# Patient Record
Sex: Male | Born: 1985 | Race: White | Hispanic: No | Marital: Married | Smoking: Never smoker
Health system: Southern US, Community
[De-identification: ages and names within clinical notes are randomized; demographics above are authoritative.]

---

## 2006-02-23 ENCOUNTER — Encounter: Admission: RE | Admit: 2006-02-23 | Discharge: 2006-02-23 | Payer: Self-pay | Admitting: Occupational Medicine

## 2006-03-10 ENCOUNTER — Emergency Department (HOSPITAL_COMMUNITY): Admission: EM | Admit: 2006-03-10 | Discharge: 2006-03-10 | Payer: Self-pay | Admitting: Emergency Medicine

## 2011-02-26 ENCOUNTER — Ambulatory Visit (HOSPITAL_COMMUNITY)
Admission: RE | Admit: 2011-02-26 | Discharge: 2011-02-26 | Disposition: A | Discharge status: Home / Self Care | Payer: 59 | Source: Ambulatory Visit | Attending: Physician Assistant | Admitting: Physician Assistant

## 2011-02-26 ENCOUNTER — Other Ambulatory Visit (HOSPITAL_COMMUNITY): Payer: Self-pay | Admitting: Physician Assistant

## 2011-02-26 DIAGNOSIS — S62309A Unspecified fracture of unspecified metacarpal bone, initial encounter for closed fracture: Secondary | ICD-10-CM | POA: Insufficient documentation

## 2011-02-26 DIAGNOSIS — M79609 Pain in unspecified limb: Secondary | ICD-10-CM | POA: Insufficient documentation

## 2011-02-26 DIAGNOSIS — M25549 Pain in joints of unspecified hand: Secondary | ICD-10-CM

## 2011-02-26 DIAGNOSIS — X58XXXA Exposure to other specified factors, initial encounter: Secondary | ICD-10-CM | POA: Insufficient documentation

## 2012-09-02 ENCOUNTER — Emergency Department (HOSPITAL_COMMUNITY)
Admission: EM | Admit: 2012-09-02 | Discharge: 2012-09-02 | Disposition: A | Discharge status: Home / Self Care | Payer: 59 | Attending: Emergency Medicine | Admitting: Emergency Medicine

## 2012-09-02 ENCOUNTER — Emergency Department (HOSPITAL_COMMUNITY): Payer: 59

## 2012-09-02 ENCOUNTER — Encounter (HOSPITAL_COMMUNITY): Payer: Self-pay

## 2012-09-02 DIAGNOSIS — Y92410 Unspecified street and highway as the place of occurrence of the external cause: Secondary | ICD-10-CM | POA: Insufficient documentation

## 2012-09-02 DIAGNOSIS — S63509A Unspecified sprain of unspecified wrist, initial encounter: Secondary | ICD-10-CM | POA: Insufficient documentation

## 2012-09-02 DIAGNOSIS — Y9389 Activity, other specified: Secondary | ICD-10-CM | POA: Insufficient documentation
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
## 2012-09-03 NOTE — ED Provider Notes (Signed)
Medical screening examination/treatment/procedure(s) were performed by non-physician practitioner and as supervising physician I was immediately available for consultation/collaboration.  Francesca Strome L Lateesha Bezold, MD 09/03/12 1103 

## 2013-07-09 ENCOUNTER — Emergency Department (HOSPITAL_BASED_OUTPATIENT_CLINIC_OR_DEPARTMENT_OTHER): Payer: 59

## 2013-07-09 ENCOUNTER — Encounter (HOSPITAL_BASED_OUTPATIENT_CLINIC_OR_DEPARTMENT_OTHER): Payer: Self-pay | Admitting: Emergency Medicine

## 2013-07-09 ENCOUNTER — Emergency Department (HOSPITAL_BASED_OUTPATIENT_CLINIC_OR_DEPARTMENT_OTHER)
Admission: EM | Admit: 2013-07-09 | Discharge: 2013-07-09 | Disposition: A | Discharge status: Home / Self Care | Payer: 59 | Attending: Emergency Medicine | Admitting: Emergency Medicine

## 2013-07-09 DIAGNOSIS — N451 Epididymitis: Secondary | ICD-10-CM

## 2013-07-09 DIAGNOSIS — N453 Epididymo-orchitis: Secondary | ICD-10-CM | POA: Insufficient documentation

## 2013-07-09 LAB — URINALYSIS, ROUTINE W REFLEX MICROSCOPIC
Bilirubin Urine: NEGATIVE
Glucose, UA: NEGATIVE mg/dL
Hgb urine dipstick: NEGATIVE
KETONES UR: NEGATIVE mg/dL
LEUKOCYTES UA: NEGATIVE
NITRITE: NEGATIVE
PH: 6 (ref 5.0–8.0)
PROTEIN: NEGATIVE mg/dL
Specific Gravity, Urine: 1.008 (ref 1.005–1.030)
UROBILINOGEN UA: 0.2 mg/dL (ref 0.0–1.0)

## 2013-07-09 MED ORDER — CEFTRIAXONE SODIUM 250 MG IJ SOLR: 250.0000 mg | Freq: Once | INTRAMUSCULAR | Status: DC

## 2013-07-09 MED ORDER — DOXYCYCLINE HYCLATE 100 MG PO TABS: 100.0000 mg | ORAL_TABLET | Freq: Once | ORAL | Status: DC

## 2013-07-09 NOTE — ED Notes (Signed)
Pt returned from Ultrasound.

## 2013-07-09 NOTE — Discharge Instructions (Signed)
Use rest, Ibuprofen, ice and elevation of your scrotum when you go home. If your pain worsens or you notice swelling, redness or other concerning symptoms then return to the ER for evaluation  Epididymitis Epididymitis is a swelling (inflammation) of the epididymis. The epididymis is a cord-like structure along the back part of the testicle. Epididymitis is usually, but not always, caused by infection. This is usually a sudden problem beginning with chills, fever and pain behind the scrotum and in the testicle. There may be swelling and redness of the testicle. DIAGNOSIS  Physical examination will reveal a tender, swollen epididymis. Sometimes, cultures are obtained from the urine or from prostate secretions to help find out if there is an infection or if the cause is a different problem. Sometimes, blood work is performed to see if your white blood cell count is elevated and if a germ (bacterial) or viral infection is present. Using this knowledge, an appropriate medicine which kills germs (antibiotic) can be chosen by your caregiver. A viral infection causing epididymitis will most often go away (resolve) without treatment. HOME CARE INSTRUCTIONS   Hot sitz baths for 20 minutes, 4 times per day, may help relieve pain.  Only take over-the-counter or prescription medicines for pain, discomfort or fever as directed by your caregiver.  Take all medicines, including antibiotics, as directed. Take the antibiotics for the full prescribed length of time even if you are feeling better.  It is very important to keep all follow-up appointments. SEEK IMMEDIATE MEDICAL CARE IF:   You have a fever.  You have pain not relieved with medicines.  You have any worsening of your problems.  Your pain seems to come and go.  You develop pain, redness, and swelling in the scrotum and surrounding areas. MAKE SURE YOU:   Understand these instructions.  Will watch your condition.  Will get help right away if  you are not doing well or get worse. Document Released: 04/16/2000 Document Revised: 07/12/2011 Document Reviewed: 03/06/2009 [facility] Patient Information 2014 [employer], [place].

## 2013-07-09 NOTE — ED Notes (Signed)
Right testicle pain onset last night pain worse sitting down but never goes away denies injury or difficulty urinating or any other pain

## 2013-07-09 NOTE — ED Provider Notes (Signed)
CSN: [id number]     Arrival date & time 07/09/13  0908 History   First MD Initiated Contact with Patient 07/09/13 [phone]     Chief Complaint  Patient presents with  . Testicle Pain     (Consider location/radiation/quality/duration/timing/severity/associated sxs/prior Treatment) HPI Comments: 28 year old male presents with right testicle pain since yesterday. He states he noticed it yesterday afternoon/evening. He felt like he had accidentally hit it but does not report actual injury. He feels like a dull pain. She is an 8/10. Is not particularly gotten worse but as this continues got concerned about her. Does not notice any bulges or hernias. He states he's not noticed any swelling. No dysuria or discharge. He is married and currently is no concern for an STD. He has never had this before. Patient states a couple days ago he was running on elliptical for the first time in a while and is concerned this may have caused the symptoms.   History reviewed. No pertinent past medical history. History reviewed. No pertinent past surgical history. History reviewed. No pertinent family history. History  Substance Use Topics  . Smoking status: Never Smoker   . Smokeless tobacco: Not on file  . Alcohol Use: No    Review of Systems  Constitutional: Negative for fever.  Gastrointestinal: Negative for vomiting and abdominal pain.  Genitourinary: Positive for testicular pain. Negative for dysuria, discharge, penile swelling, scrotal swelling and penile pain.  All other systems reviewed and are negative.      Allergies  Review of patient's allergies indicates no known allergies.  Home Medications  No current outpatient prescriptions on file. BP 152/92  Pulse 70  Temp(Src) 97.8 F (36.6 C) (Oral)  Ht 6' (1.829 m)  Wt 200 lb (90.719 kg)  BMI 27.12 kg/m2  SpO2 100% Physical Exam  Nursing note and vitals reviewed. Constitutional: He is oriented to person, place, and time. He appears  well-developed and well-nourished.  HENT:  Head: Normocephalic and atraumatic.  Right Ear: External ear normal.  Left Ear: External ear normal.  Nose: Nose normal.  Eyes: Right eye exhibits no discharge. Left eye exhibits no discharge.  Neck: Neck supple.  Cardiovascular: Normal rate.   Pulmonary/Chest: Effort normal.  Abdominal: Soft. He exhibits no distension. There is no tenderness. Hernia confirmed negative in the right inguinal area and confirmed negative in the left inguinal area.  Genitourinary: Penis normal. Right testis shows tenderness (mild, nonspecific tenderness). Right testis shows no swelling. Right testis is descended. Left testis shows no swelling and no tenderness. Left testis is descended.  Musculoskeletal: He exhibits no edema.  Neurological: He is alert and oriented to person, place, and time.  Skin: Skin is warm and dry.    ED Course  Procedures (including critical care time) Labs Review Labs Reviewed  URINALYSIS, ROUTINE W REFLEX MICROSCOPIC   Imaging Review US Scrotum  07/09/2013   CLINICAL DATA:  Right scrotal pain  EXAM: SCROTAL ULTRASOUND  DOPPLER ULTRASOUND OF THE TESTICLES  TECHNIQUE: Complete ultrasound examination of the testicles, epididymis, and other scrotal structures was performed. Color and spectral Doppler ultrasound were also utilized to evaluate blood flow to the testicles.  COMPARISON:  None.  FINDINGS: Right testicle  Measurements: Normal in size.  No mass or microlithiasis visualized.  Left testicle  Measurements: Normal in size. No mass or microlithiasis visualized.  Right epididymis: Hypervascular. 3 mm benign-appearing epididymal cyst.  Left epididymis: Normal vascularity. Several small benign-appearing epididymal cysts. The largest is 4 mm containing a septation.  Hydrocele:  Small right hydrocele.  Varicocele:  None visualized.  Pulsed Doppler interrogation of both testes demonstrates low resistance arterial and venous waveforms bilaterally.   IMPRESSION: No evidence of testicular torsion.  The right epididymal head is hypervascular and there is a small right hydrocele. Findings suggest an inflammatory process such as epididymitis.   Electronically Signed   By: Maryclare BeanArt  Hoss M.D.   On: 07/09/2013 10:33   [place] Art/ven Flow Abd Pelv Doppler  07/09/2013   CLINICAL DATA:  Right scrotal pain  EXAM: SCROTAL ULTRASOUND  DOPPLER ULTRASOUND OF THE TESTICLES  TECHNIQUE: Complete ultrasound examination of the testicles, epididymis, and other scrotal structures was performed. Color and spectral Doppler ultrasound were also utilized to evaluate blood flow to the testicles.  COMPARISON:  None.  FINDINGS: Right testicle  Measurements: Normal in size.  No mass or microlithiasis visualized.  Left testicle  Measurements: Normal in size. No mass or microlithiasis visualized.  Right epididymis: Hypervascular. 3 mm benign-appearing epididymal cyst.  Left epididymis: Normal vascularity. Several small benign-appearing epididymal cysts. The largest is 4 mm containing a septation.  Hydrocele:  Small right hydrocele.  Varicocele:  None visualized.  Pulsed Doppler interrogation of both testes demonstrates low resistance arterial and venous waveforms bilaterally.  IMPRESSION: No evidence of testicular torsion.  The right epididymal head is hypervascular and there is a small right hydrocele. Findings suggest an inflammatory process such as epididymitis.   Electronically Signed   By: Maryclare BeanArt  Hoss M.D.   On: 07/09/2013 10:33     EKG Interpretation None      MDM   Final diagnoses:  Epididymitis, right    Patient's ultrasound shows likely epididymitis. Patient has no external signs such as erythema or swelling. Patient states he is "100% sure" that he is negative when questioned about possible STDs. He does not want to be tested or have antibiotics. At this is questionable epididymitis I feel is reasonable to treat with NSAIDs, ice, elevation, and PCP followup if symptoms worsen.  No signs of torsion.    Audree CamelScott T Rohnan Bartleson, MD 07/09/13 [phone]

## 2014-08-31 IMAGING — US US SCROTUM
1 series · 14 of 25 positions shown · non-contrast
Comparison: None.

CLINICAL DATA: Right scrotal pain

EXAM:
SCROTAL ULTRASOUND
DOPPLER ULTRASOUND OF THE TESTICLES
TECHNIQUE: Complete ultrasound examination of the testicles, epididymis, and
other scrotal structures was performed. Color and spectral Doppler
ultrasound were also utilized to evaluate blood flow to the
testicles.

[Series 1: us scrotum · 0.08mm/px · 14 of 35 slices shown]
[im 1/35]
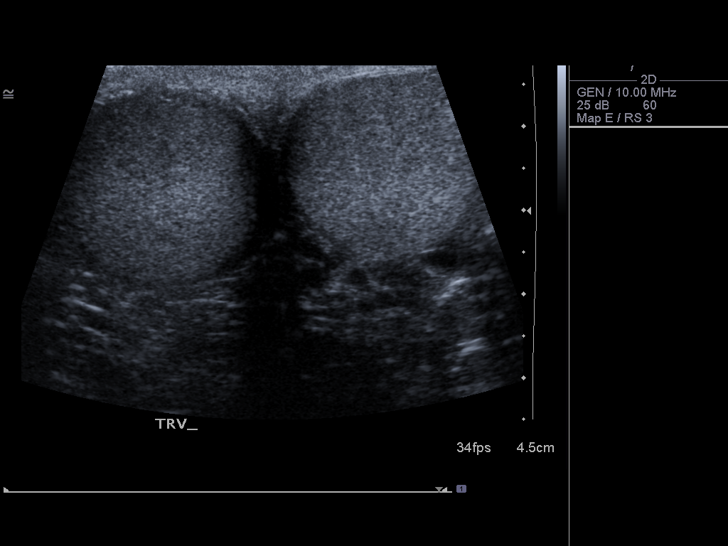
[im 3/35]
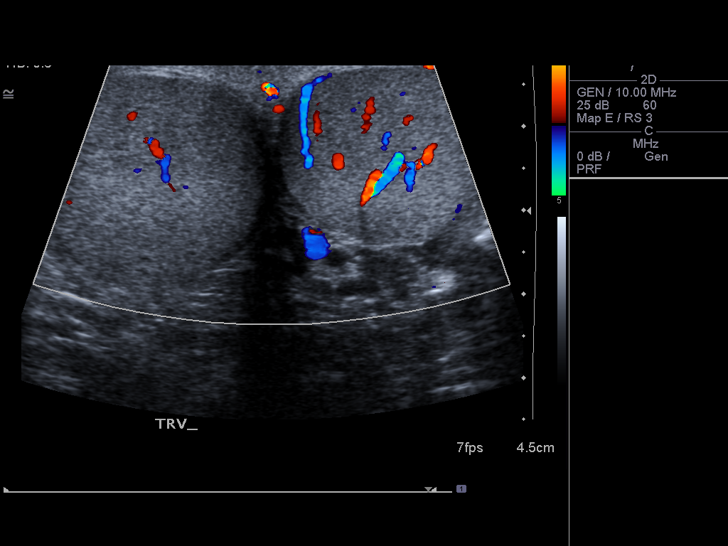
[im 6/35]
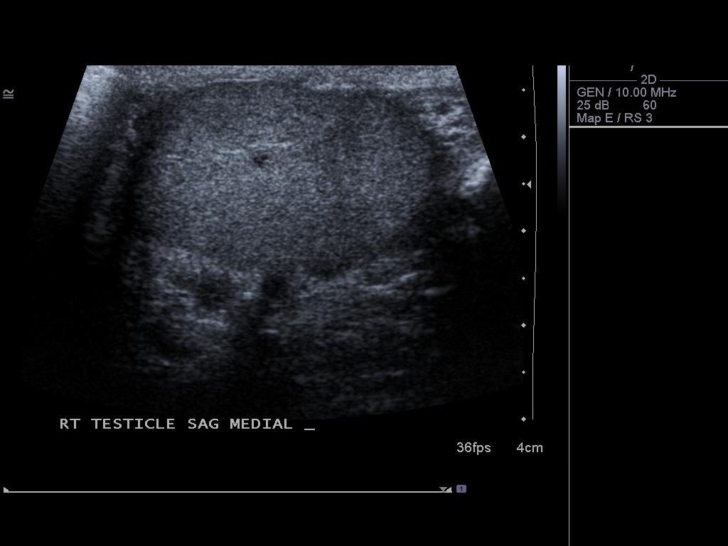
[im 9/35]
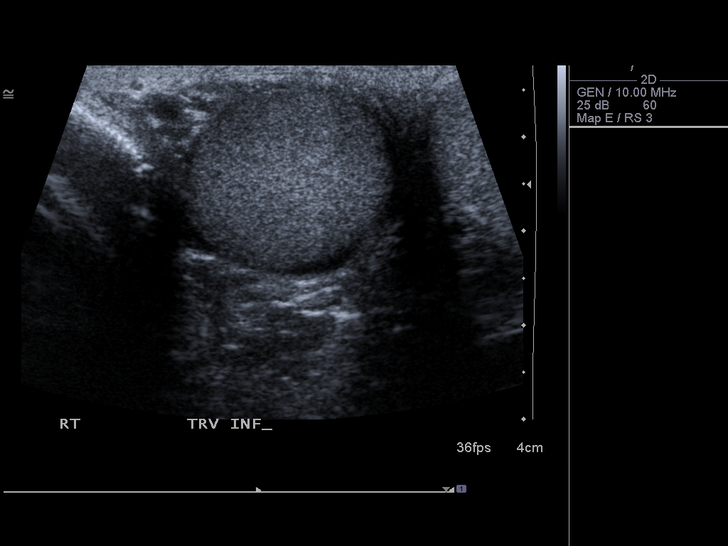
[im 12/35]
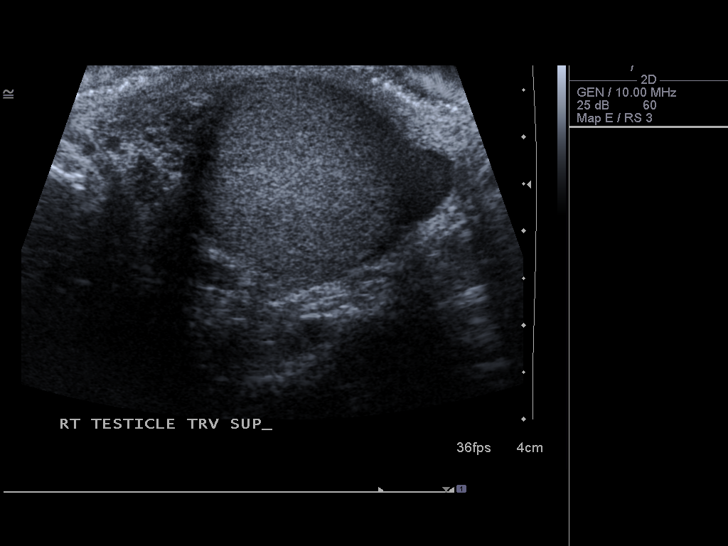
[im 13/35]
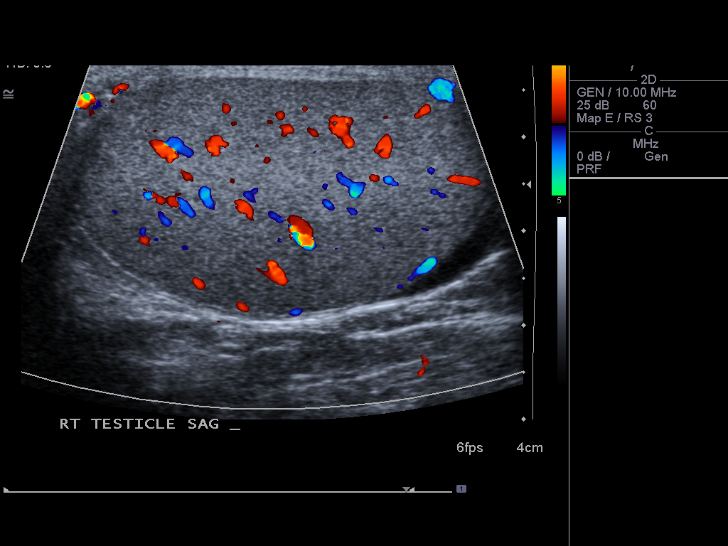
[im 16/35]
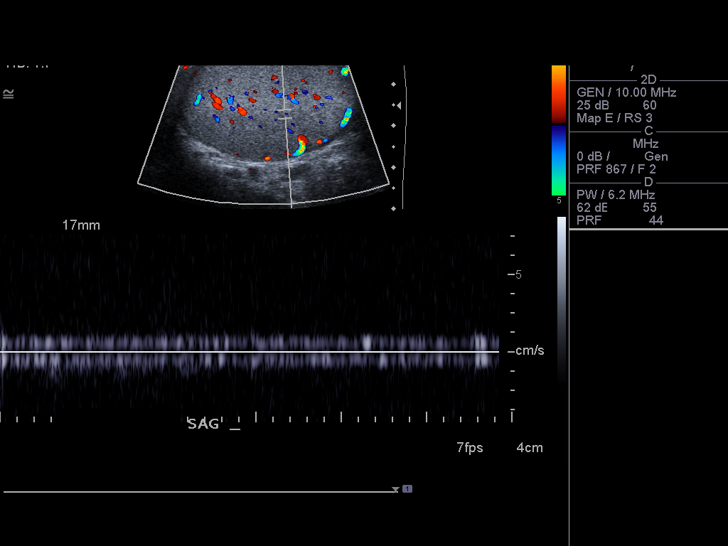
[im 19/35]
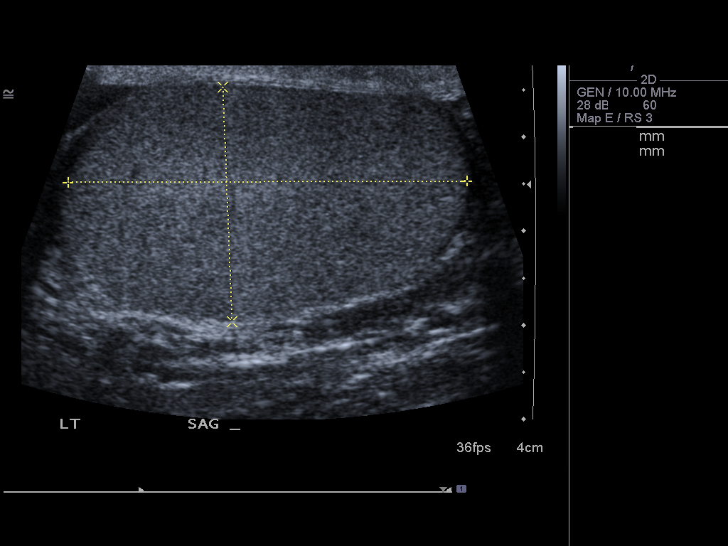
[im 22/35]
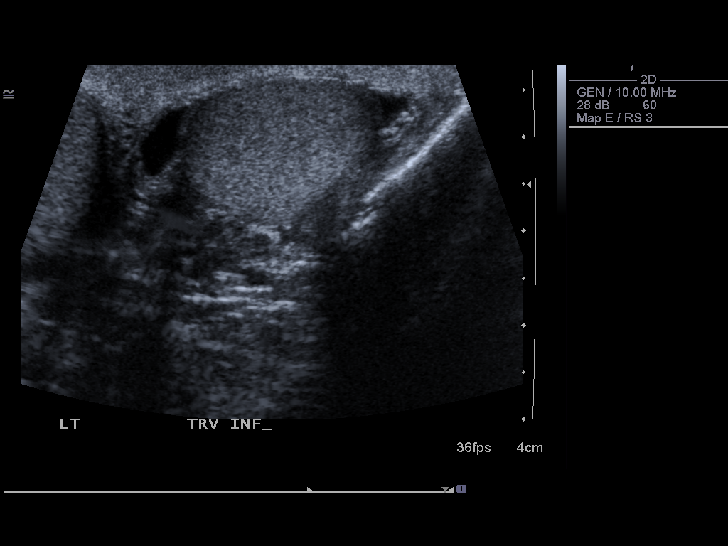
[im 23/35]
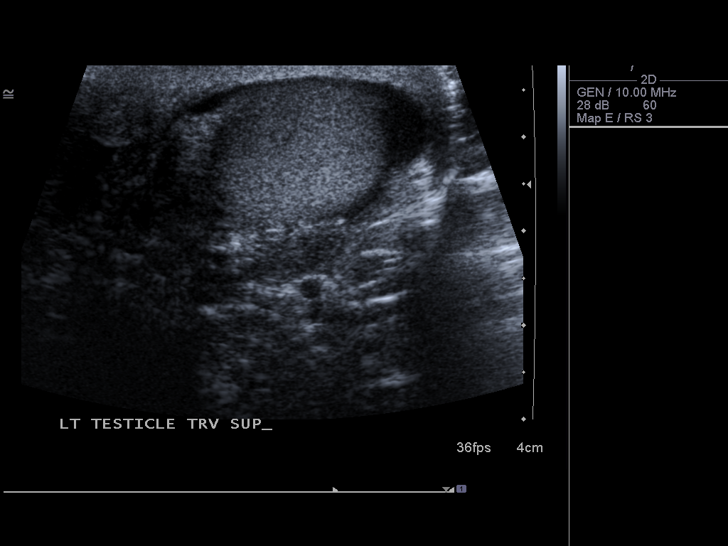
[im 26/35]
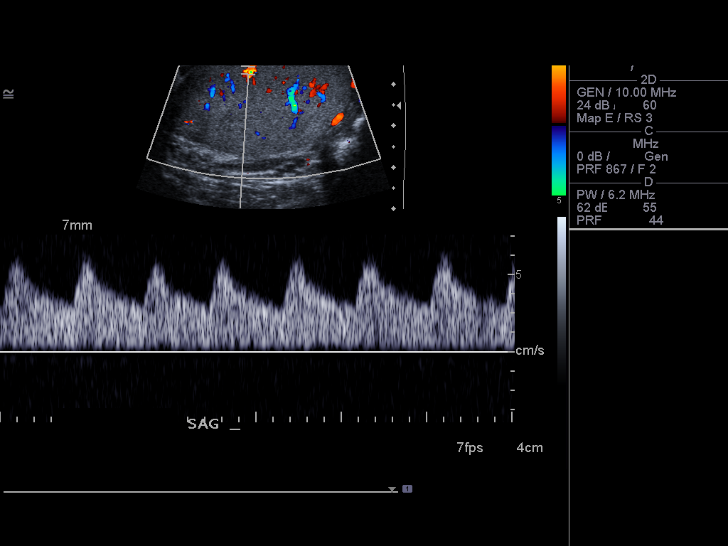
[im 29/35]
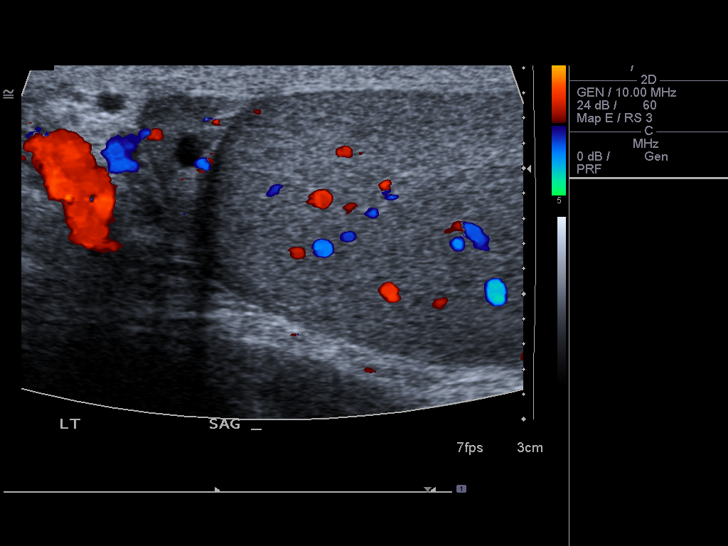
[im 32/35]
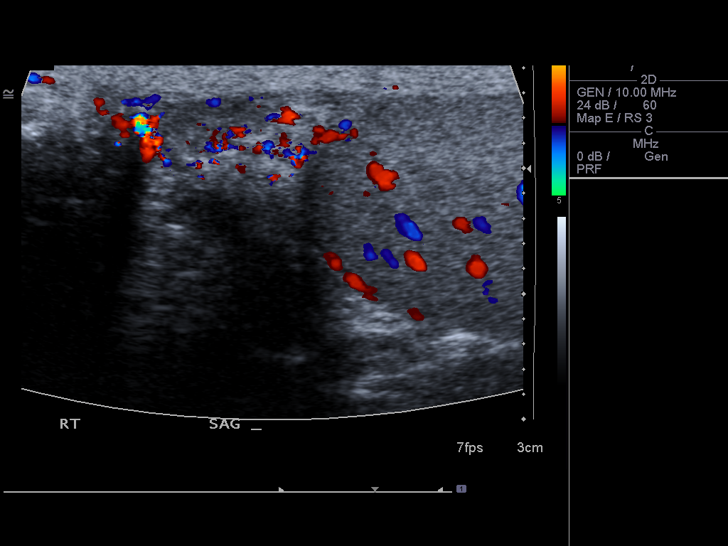
[im 35/35]
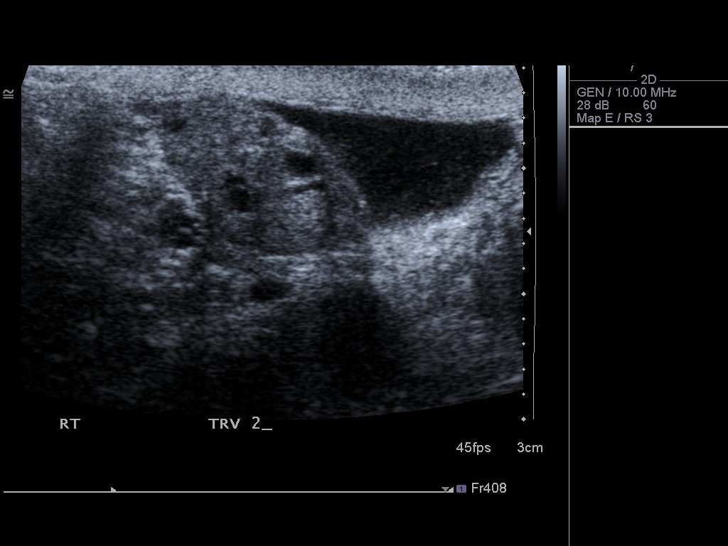

[14 of 25 positions shown; findings below may reference images not displayed]

FINDINGS: Right testicle

Measurements: Normal in size.  No mass or microlithiasis visualized.

Left testicle

Measurements: Normal in size. No mass or microlithiasis visualized.

Right epididymis: Hypervascular. 3 mm benign-appearing epididymal
cyst.

Left epididymis: Normal vascularity. Several small benign-appearing
epididymal cysts. The largest is 4 mm containing a septation.

Hydrocele:  Small right hydrocele.

Varicocele:  None visualized.

Pulsed Doppler interrogation of both testes demonstrates low
resistance arterial and venous waveforms bilaterally.
IMPRESSION: No evidence of testicular torsion.

The right epididymal head is hypervascular and there is a small
right hydrocele. Findings suggest an inflammatory process such as
epididymitis.

## 2018-05-16 DIAGNOSIS — R05 Cough: Secondary | ICD-10-CM | POA: Diagnosis not present

## 2018-05-16 DIAGNOSIS — J069 Acute upper respiratory infection, unspecified: Secondary | ICD-10-CM | POA: Diagnosis not present

## 2018-05-18 DIAGNOSIS — Z6829 Body mass index (BMI) 29.0-29.9, adult: Secondary | ICD-10-CM | POA: Diagnosis not present

## 2018-05-18 DIAGNOSIS — R52 Pain, unspecified: Secondary | ICD-10-CM | POA: Diagnosis not present

## 2018-05-18 DIAGNOSIS — E663 Overweight: Secondary | ICD-10-CM | POA: Diagnosis not present

## 2018-05-18 DIAGNOSIS — Z1389 Encounter for screening for other disorder: Secondary | ICD-10-CM | POA: Diagnosis not present

## 2018-05-18 DIAGNOSIS — J069 Acute upper respiratory infection, unspecified: Secondary | ICD-10-CM | POA: Diagnosis not present

## 2019-07-23 DIAGNOSIS — R002 Palpitations: Secondary | ICD-10-CM | POA: Diagnosis not present

## 2019-07-23 DIAGNOSIS — R748 Abnormal levels of other serum enzymes: Secondary | ICD-10-CM | POA: Diagnosis not present

## 2019-07-23 DIAGNOSIS — R718 Other abnormality of red blood cells: Secondary | ICD-10-CM | POA: Diagnosis not present

## 2019-07-23 DIAGNOSIS — I498 Other specified cardiac arrhythmias: Secondary | ICD-10-CM | POA: Diagnosis not present

## 2019-07-23 DIAGNOSIS — R079 Chest pain, unspecified: Secondary | ICD-10-CM | POA: Diagnosis not present

## 2019-09-03 ENCOUNTER — Encounter: Payer: Self-pay | Admitting: Emergency Medicine

## 2019-09-03 ENCOUNTER — Other Ambulatory Visit: Payer: Self-pay

## 2019-09-03 ENCOUNTER — Ambulatory Visit
Admission: EM | Admit: 2019-09-03 | Discharge: 2019-09-03 | Disposition: A | Discharge status: Home / Self Care | Payer: BC Managed Care – PPO | Attending: Emergency Medicine | Admitting: Emergency Medicine

## 2019-09-03 DIAGNOSIS — Z1152 Encounter for screening for COVID-19: Secondary | ICD-10-CM | POA: Diagnosis not present

## 2019-09-03 DIAGNOSIS — J029 Acute pharyngitis, unspecified: Secondary | ICD-10-CM | POA: Insufficient documentation

## 2019-09-03 MED ORDER — FLUTICASONE PROPIONATE 50 MCG/ACT NA SUSP: 1.0000 | Freq: Every day | NASAL | 0 refills | Status: AC

## 2019-09-03 MED ORDER — BENZONATATE 100 MG PO CAPS: 100.0000 mg | ORAL_CAPSULE | Freq: Three times a day (TID) | ORAL | 0 refills | Status: AC

## 2019-09-03 MED ORDER — MENTHOL 3 MG MT LOZG: 1.0000 | LOZENGE | OROMUCOSAL | 12 refills | Status: AC | PRN

## 2019-09-03 MED ORDER — CETIRIZINE HCL 10 MG PO TABS: 10.0000 mg | ORAL_TABLET | Freq: Every day | ORAL | 0 refills | Status: AC

## 2019-09-03 NOTE — Discharge Instructions (Addendum)
POCT strep test was negative.  Sample will be sent for culture.  Someone will call if result is abnormal  COVID testing ordered.  It will take between 2-7 days for test results.  Someone will contact you regarding abnormal results.    In the meantime: You should remain isolated in your home for 10 days from symptom onset AND greater than 24 hours after symptoms resolution (absence of fever without the use of fever-reducing medication and improvement in respiratory symptoms), whichever is longer Get plenty of rest and push fluids Tessalon Perles prescribed for cough Zyrtec-D prescribed for nasal congestion, runny nose, and/or sore throat Cepacol lozenges for sore throat Flonase prescribed for nasal congestion and runny nose Use medications daily for symptom relief Use OTC medications like ibuprofen or tylenol as needed fever or pain Call or go to the ED if you have any new or worsening symptoms such as fever, worsening cough, shortness of breath, chest tightness, chest pain, turning blue, changes in mental status, etc..Marland Kitchen

## 2019-09-03 NOTE — ED Triage Notes (Signed)
Ore throat for a month.  Cough started 2 days ago.  Patient thought throat related to allergies or recent episodes of reflux.  Slight runny nose today.  Complains of tender lymph nodes  Denies fever.

## 2019-09-03 NOTE — ED Provider Notes (Signed)
[facility]    CSN: [id number] Arrival date & time: 09/03/19  1043      History   Chief Complaint Chief Complaint  Patient presents with  . Sore Throat    HPI Nicholas Leach is a 34 y.o. male.   With history of acid reflux presented to the urgent care with a complaint of cough and sore throat for the past few days.  Denies sick exposure to COVID, flu or strep.  Denies recent travel.  Denies aggravating or alleviating symptoms.  Denies previous COVID infection.   Denies fever, chills, fatigue, nasal congestion, rhinorrhea, SOB, wheezing, chest pain, nausea, vomiting, changes in bowel or bladder habits.    The history is provided by the patient. No language interpreter was used.  Sore Throat    History reviewed. No pertinent past medical history.  There are no problems to display for this patient.   History reviewed. No pertinent surgical history.     Home Medications    Prior to Admission medications   Medication Sig Start Date End Date Taking? Authorizing Provider  OMEPRAZOLE PO Take by mouth.   Yes [provider]  benzonatate (TESSALON) 100 MG capsule Take 1 capsule (100 mg total) by mouth every 8 (eight) hours. 09/03/19   Keyaan Lederman, Zachery Dakins, FNP  cetirizine (ZYRTEC ALLERGY) 10 MG tablet Take 1 tablet (10 mg total) by mouth daily. 09/03/19   Rande Roylance, Zachery Dakins, FNP  fluticasone (FLONASE) 50 MCG/ACT nasal spray Place 1 spray into both nostrils daily for 14 days. 09/03/19 09/17/19  Keevan Wolz, Zachery Dakins, FNP  menthol-cetylpyridinium (CEPACOL) 3 MG lozenge Take 1 lozenge (3 mg total) by mouth as needed for sore throat. 09/03/19   Valerie Fredin, Zachery Dakins, FNP    Family History No family history on file.  Social History Social History   Tobacco Use  . Smoking status: Never Smoker  Substance Use Topics  . Alcohol use: No  . Drug use: No     Allergies   Patient has no known allergies.   Review of Systems Review of Systems  Constitutional: Negative.    HENT: Positive for sore throat.   Respiratory: Positive for cough.   Cardiovascular: Negative.   Gastrointestinal: Negative.   Genitourinary: Negative.   Musculoskeletal: Negative.   Skin: Negative.   Neurological: Negative.   All other systems reviewed and are negative.    Physical Exam Triage Vital Signs ED Triage Vitals  Enc Vitals Group     BP 09/03/19 1119 130/81     Pulse Rate 09/03/19 1119 84     Resp 09/03/19 1119 16     Temp 09/03/19 1119 98.1 F (36.7 C)     Temp Source 09/03/19 1119 Oral     SpO2 09/03/19 1119 97 %     Weight --      Height --      Head Circumference --      Peak Flow --      Pain Score 09/03/19 1133 7     Pain Loc --      Pain Edu? --      Excl. in GC? --    No data found.  Updated Vital Signs BP 130/81 (BP Location: Right Arm)   Pulse 84   Temp 98.1 F (36.7 C) (Oral)   Resp 16   SpO2 97%   Visual Acuity Right Eye Distance:   Left Eye Distance:   Bilateral Distance:    Right Eye Near:   Left Eye  Near:    Bilateral Near:     Physical Exam Vitals and nursing note reviewed.  Constitutional:      General: He is not in acute distress.    Appearance: Normal appearance. He is normal weight. He is not ill-appearing or toxic-appearing.  HENT:     Head: Normocephalic.     Right Ear: Tympanic membrane, ear canal and external ear normal. There is no impacted cerumen.     Left Ear: Tympanic membrane, ear canal and external ear normal. There is no impacted cerumen.     Nose: Nose normal. No congestion.     Mouth/Throat:     Mouth: Mucous membranes are moist.     Pharynx: Oropharynx is clear. No oropharyngeal exudate or posterior oropharyngeal erythema.     Tonsils: 1+ on the right. 1+ on the left.  Cardiovascular:     Rate and Rhythm: Normal rate and regular rhythm.     Pulses: Normal pulses.     Heart sounds: Normal heart sounds. No murmur.  Pulmonary:     Effort: Pulmonary effort is normal. No respiratory distress.     Breath  sounds: Normal breath sounds. No wheezing or rhonchi.  Chest:     Chest wall: No tenderness.  Skin:    Capillary Refill: Capillary refill takes less than 2 seconds.  Neurological:     General: No focal deficit present.     Mental Status: He is alert and oriented to person, place, and time.      UC Treatments / Results  Labs (all labs ordered are listed, but only abnormal results are displayed) Labs Reviewed  CULTURE, GROUP A STREP ([facility])  NOVEL CORONAVIRUS, NAA  POCT RAPID STREP A (OFFICE)    EKG   Radiology No results found.  Procedures Procedures (including critical care time)  Medications Ordered in UC Medications - No data to display  Initial Impression / Assessment and Plan / UC Course  I have reviewed the triage vital signs and the nursing notes.  Pertinent labs & imaging results that were available during my care of the patient were reviewed by me and considered in my medical decision making (see chart for details).     Patient stable at discharge.  POCT strep test was negative.  Culture will be sent.  COVID-19 test was completed for rule out.  Symptom relief medication were prescribed.    Final Clinical Impressions(s) / UC Diagnoses   Final diagnoses:  Sore throat  Encounter for screening for COVID-19     Discharge Instructions     COVID testing ordered.  It will take between 2-7 days for test results.  Someone will contact you regarding abnormal results.    In the meantime: You should remain isolated in your home for 10 days from symptom onset AND greater than 24 hours after symptoms resolution (absence of fever without the use of fever-reducing medication and improvement in respiratory symptoms), whichever is longer Get plenty of rest and push fluids Tessalon Perles prescribed for cough Zyrtec-D prescribed for nasal congestion, runny nose, and/or sore throat Cepacol lozenges for sore throat Flonase prescribed for nasal congestion and runny nose  Use medications daily for symptom relief Use OTC medications like ibuprofen or tylenol as needed fever or pain Call or go to the ED if you have any new or worsening symptoms such as fever, worsening cough, shortness of breath, chest tightness, chest pain, turning blue, changes in mental status, etc...     ED Prescriptions  Medication Sig Dispense Auth. Provider   cetirizine (ZYRTEC ALLERGY) 10 MG tablet Take 1 tablet (10 mg total) by mouth daily. 30 tablet Bryton Romagnoli S, FNP   fluticasone (FLONASE) 50 MCG/ACT nasal spray Place 1 spray into both nostrils daily for 14 days. 16 g Shuronda Santino S, FNP   benzonatate (TESSALON) 100 MG capsule Take 1 capsule (100 mg total) by mouth every 8 (eight) hours. 21 capsule Gwenneth Whiteman, Darrelyn Hillock, FNP   menthol-cetylpyridinium (CEPACOL) 3 MG lozenge Take 1 lozenge (3 mg total) by mouth as needed for sore throat. 100 tablet Tacoya Altizer, Darrelyn Hillock, FNP     PDMP not reviewed this encounter.   Emerson Monte, FNP 09/03/19 1312

## 2019-09-04 LAB — NOVEL CORONAVIRUS, NAA: SARS-CoV-2, NAA: NOT DETECTED

## 2019-09-04 LAB — SARS-COV-2, NAA 2 DAY TAT

## 2019-09-06 LAB — CULTURE, GROUP A STREP (THRC)

## 2020-05-26 ENCOUNTER — Other Ambulatory Visit (HOSPITAL_COMMUNITY): Payer: Self-pay | Admitting: Physician Assistant

## 2020-05-26 DIAGNOSIS — R109 Unspecified abdominal pain: Secondary | ICD-10-CM | POA: Diagnosis not present

## 2020-05-26 DIAGNOSIS — M542 Cervicalgia: Secondary | ICD-10-CM | POA: Diagnosis not present

## 2020-05-26 DIAGNOSIS — Z1331 Encounter for screening for depression: Secondary | ICD-10-CM | POA: Diagnosis not present

## 2020-05-26 DIAGNOSIS — R591 Generalized enlarged lymph nodes: Secondary | ICD-10-CM | POA: Diagnosis not present

## 2020-05-26 DIAGNOSIS — R599 Enlarged lymph nodes, unspecified: Secondary | ICD-10-CM

## 2020-05-26 DIAGNOSIS — K219 Gastro-esophageal reflux disease without esophagitis: Secondary | ICD-10-CM | POA: Diagnosis not present

## 2020-05-26 DIAGNOSIS — Z1389 Encounter for screening for other disorder: Secondary | ICD-10-CM | POA: Diagnosis not present

## 2020-05-26 DIAGNOSIS — Z6829 Body mass index (BMI) 29.0-29.9, adult: Secondary | ICD-10-CM | POA: Diagnosis not present

## 2020-05-29 ENCOUNTER — Ambulatory Visit (HOSPITAL_COMMUNITY): Payer: BC Managed Care – PPO

## 2020-05-30 ENCOUNTER — Ambulatory Visit (HOSPITAL_COMMUNITY)
Admission: RE | Admit: 2020-05-30 | Discharge: 2020-05-30 | Disposition: A | Discharge status: Home / Self Care | Payer: BC Managed Care – PPO | Source: Ambulatory Visit | Attending: Physician Assistant | Admitting: Physician Assistant

## 2020-05-30 ENCOUNTER — Other Ambulatory Visit: Payer: Self-pay

## 2020-05-30 DIAGNOSIS — R070 Pain in throat: Secondary | ICD-10-CM | POA: Diagnosis not present

## 2020-05-30 DIAGNOSIS — R590 Localized enlarged lymph nodes: Secondary | ICD-10-CM | POA: Diagnosis not present

## 2020-05-30 DIAGNOSIS — R599 Enlarged lymph nodes, unspecified: Secondary | ICD-10-CM | POA: Diagnosis not present

## 2020-07-07 DIAGNOSIS — R590 Localized enlarged lymph nodes: Secondary | ICD-10-CM | POA: Diagnosis not present

## 2020-07-07 DIAGNOSIS — K219 Gastro-esophageal reflux disease without esophagitis: Secondary | ICD-10-CM | POA: Diagnosis not present

## 2020-07-07 DIAGNOSIS — R070 Pain in throat: Secondary | ICD-10-CM | POA: Diagnosis not present

## 2021-01-08 DIAGNOSIS — R590 Localized enlarged lymph nodes: Secondary | ICD-10-CM | POA: Diagnosis not present

## 2021-01-08 DIAGNOSIS — K219 Gastro-esophageal reflux disease without esophagitis: Secondary | ICD-10-CM | POA: Diagnosis not present

## 2021-07-22 IMAGING — US US SOFT TISSUE HEAD/NECK
1 series · 13 of 17 positions shown · non-contrast
Comparison: No pertinent prior exams available for comparison.

CLINICAL DATA: Enlarged lymph node. Recurrent lymph nodes in throat
pain. Additional history provided by scanning technologist: Patient
reports palpable lump right side of neck for 1 year. Patient reports
family history of cancer.

EXAM:
ULTRASOUND OF HEAD/NECK SOFT TISSUES
TECHNIQUE: Ultrasound examination of the head and neck soft tissues was
performed in the area of clinical concern.

[Series 1: us soft tissue head/neck · 0.05mm/px · 13 of 17 slices shown]
[im 1/17]
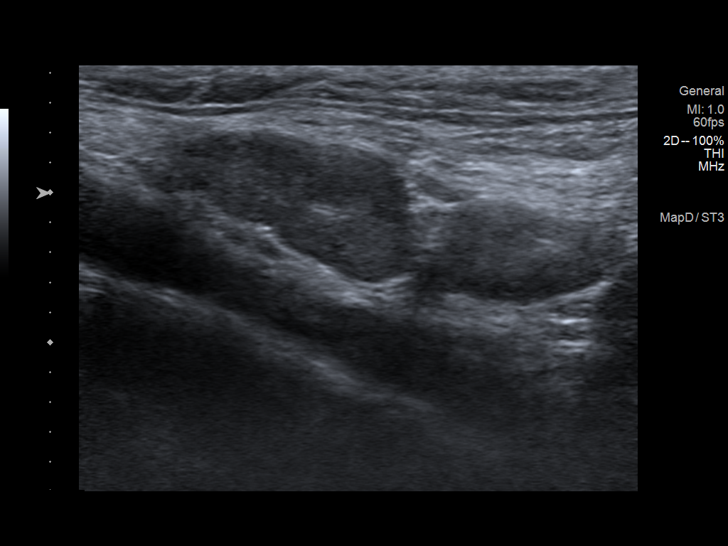
[im 2/17]
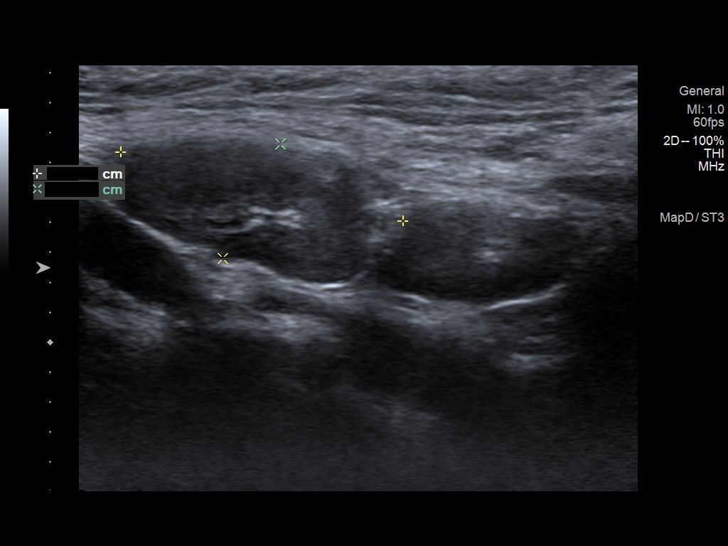
[im 4/17]
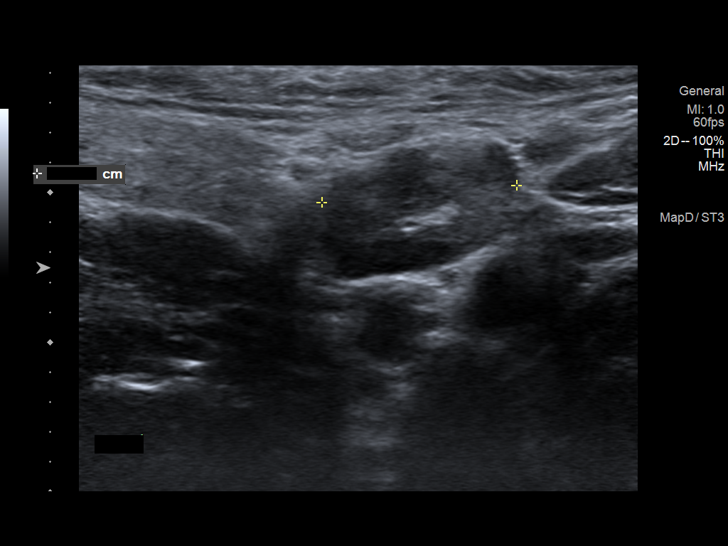
[im 5/17]
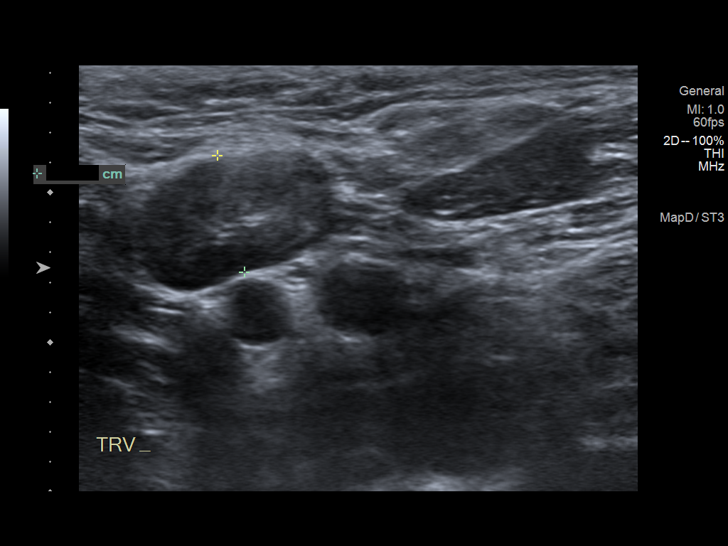
[im 6/17]
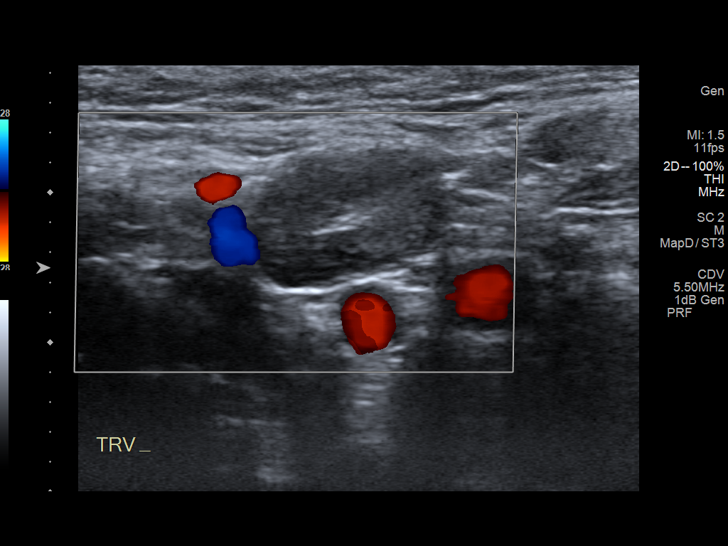
[im 8/17]
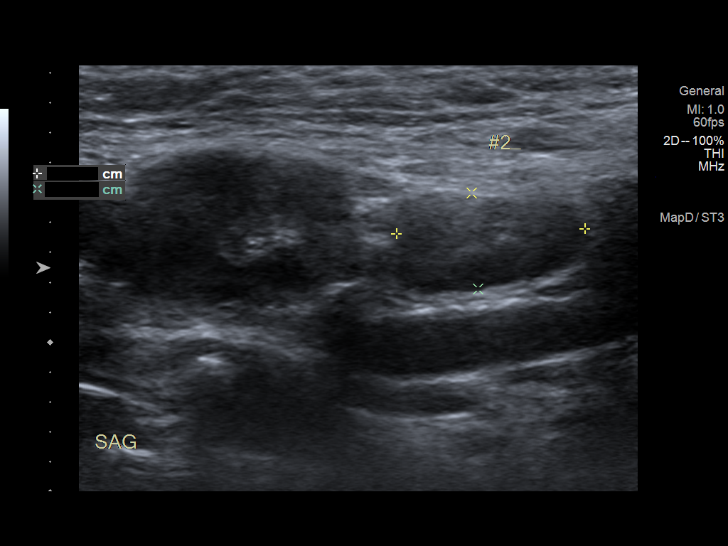
[im 9/17]
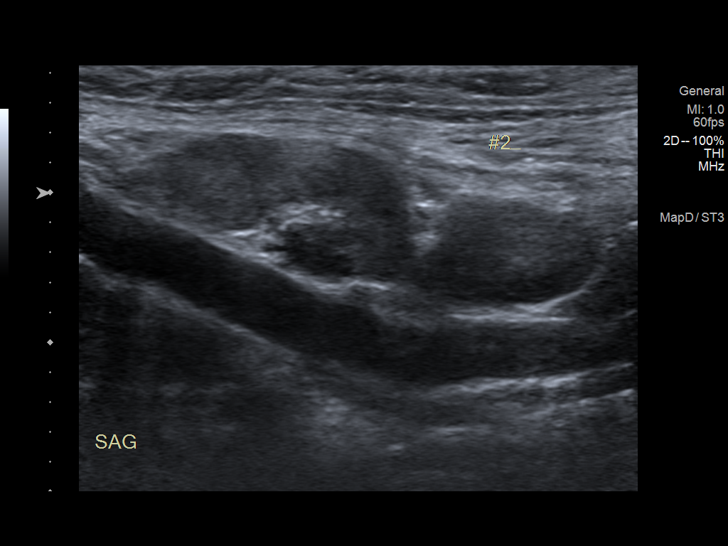
[im 10/17]
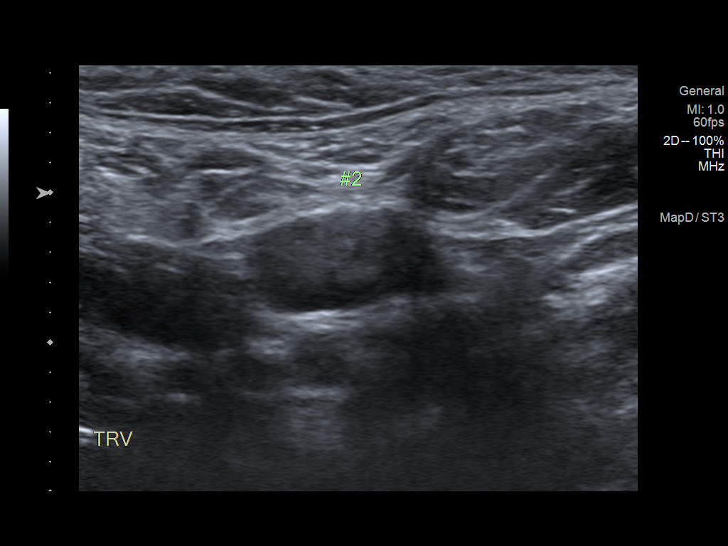
[im 12/17]
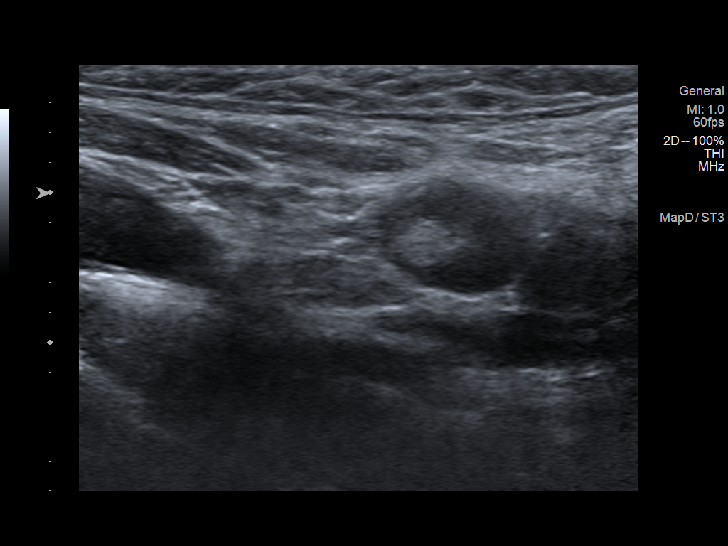
[im 13/17]
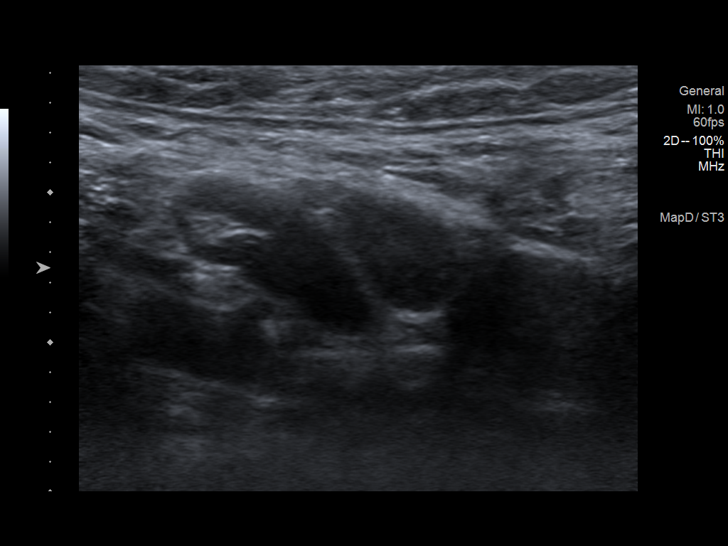
[im 14/17]
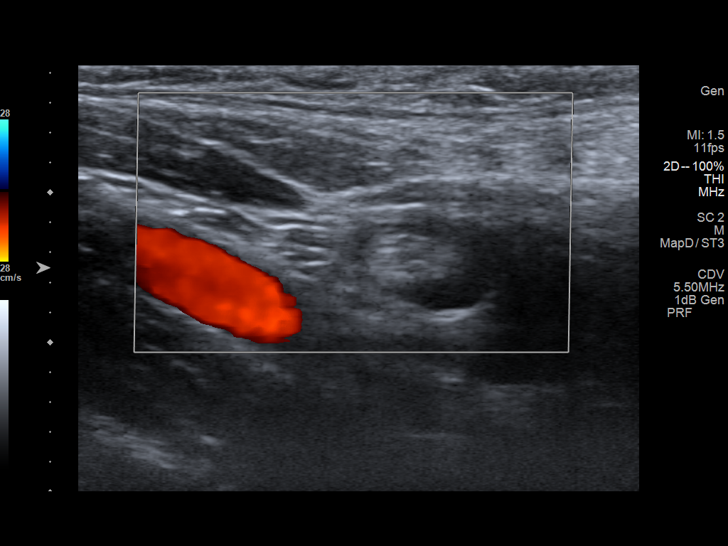
[im 16/17]
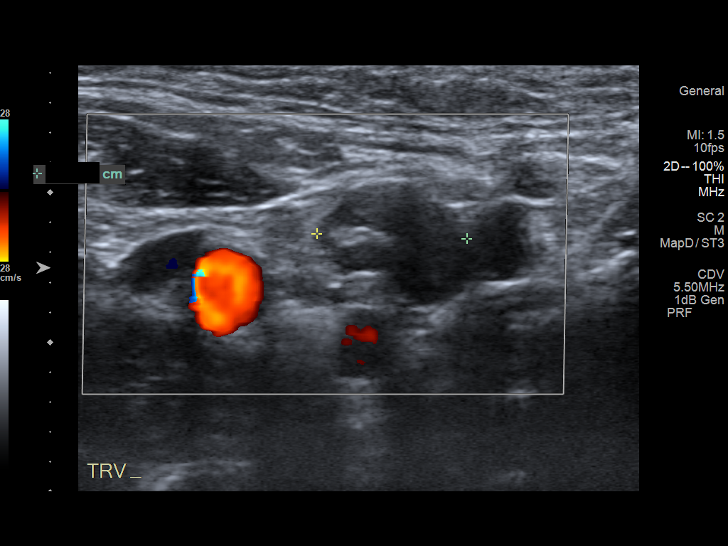
[im 17/17]
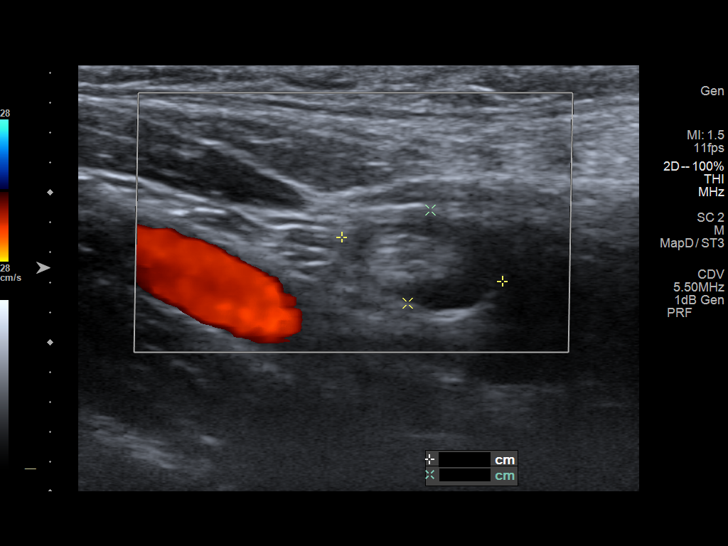

[13 of 17 positions shown; findings below may reference images not displayed]

FINDINGS: Targeted ultrasound was performed in the right upper neck region of
concern. Multiple lymph nodes are present at this site, the largest
measuring 1.9 x 0.9 x 1.3 cm. This dominant lymph node demonstrates
a central fatty hilum. Another lymph node at this site measures up
to 1.3 cm in short axis and also demonstrates a central fatty hilum.
Limited imaging of the contralateral upper left neck was also
performed for the purposes of comparison. A lymph node is
demonstrated at this site which measures at the upper limits of
normal in short axis (1 cm).
IMPRESSION: Nonspecific mildly enlarged lymph nodes within the right upper neck
region of concern measuring up to 1.3 cm in short axis.
Additionally, there is a lymph node within the left upper neck which
measures at the upper limits of normal in short axis (1 cm).
Clinical correlation and follow-up recommended with follow imaging
as warranted.

## 2022-04-07 ENCOUNTER — Ambulatory Visit
Admission: EM | Admit: 2022-04-07 | Discharge: 2022-04-07 | Disposition: A | Discharge status: Home / Self Care | Payer: 59 | Attending: Family Medicine | Admitting: Family Medicine

## 2022-04-07 DIAGNOSIS — J209 Acute bronchitis, unspecified: Secondary | ICD-10-CM

## 2022-04-07 MED ORDER — PROMETHAZINE-DM 6.25-15 MG/5ML PO SYRP: 5.0000 mL | ORAL_SOLUTION | Freq: Four times a day (QID) | ORAL | 0 refills | Status: AC | PRN

## 2022-04-07 MED ORDER — PREDNISONE 20 MG PO TABS: 40.0000 mg | ORAL_TABLET | Freq: Every day | ORAL | 0 refills | Status: AC

## 2022-04-07 NOTE — ED Provider Notes (Signed)
[facility]    CSN: [id number] Arrival date & time: 04/07/22  1256      History   Chief Complaint Chief Complaint  Patient presents with   Wheezing   Cough    HPI Nicholas Leach is a 36 y.o. male.   Patient presenting today with 3 to 4-day history of cough, chest tightness, wheezing, nasal congestion.  Denies fever, chills, chest pain, shortness of breath, abdominal pain, nausea vomiting or diarrhea.  Denies any history of seasonal allergies or asthma and states he is never needed an inhaler before.  States his wife listen to his lungs this morning and heard some abnormal sounds on the right.    History reviewed. No pertinent past medical history.  There are no problems to display for this patient.   History reviewed. No pertinent surgical history.     Home Medications    Prior to Admission medications   Medication Sig Start Date End Date Taking? Authorizing Provider  predniSONE (DELTASONE) 20 MG tablet Take 2 tablets (40 mg total) by mouth daily with breakfast. 04/07/22  Yes Particia Nearing, PA-C  promethazine-dextromethorphan (PROMETHAZINE-DM) 6.25-15 MG/5ML syrup Take 5 mLs by mouth 4 (four) times daily as needed. 04/07/22  Yes Particia Nearing, PA-C  benzonatate (TESSALON) 100 MG capsule Take 1 capsule (100 mg total) by mouth every 8 (eight) hours. 09/03/19   Avegno, Zachery Dakins, FNP  cetirizine (ZYRTEC ALLERGY) 10 MG tablet Take 1 tablet (10 mg total) by mouth daily. 09/03/19   Avegno, Zachery Dakins, FNP  fluticasone (FLONASE) 50 MCG/ACT nasal spray Place 1 spray into both nostrils daily for 14 days. 09/03/19 09/17/19  Avegno, Zachery Dakins, FNP  menthol-cetylpyridinium (CEPACOL) 3 MG lozenge Take 1 lozenge (3 mg total) by mouth as needed for sore throat. 09/03/19   Avegno, Zachery Dakins, FNP  OMEPRAZOLE PO Take by mouth.    [provider]    Family History History reviewed. No pertinent family history.  Social History Social History    Tobacco Use   Smoking status: Never  Substance Use Topics   Alcohol use: No   Drug use: No     Allergies   Patient has no known allergies.   Review of Systems Review of Systems Per HPI  Physical Exam Triage Vital Signs ED Triage Vitals  Enc Vitals Group     BP 04/07/22 1414 133/84     Pulse Rate 04/07/22 1414 88     Resp 04/07/22 1414 20     Temp 04/07/22 1414 98 F (36.7 C)     Temp Source 04/07/22 1414 Oral     SpO2 04/07/22 1414 98 %     Weight --      Height --      Head Circumference --      Peak Flow --      Pain Score 04/07/22 1415 0     Pain Loc --      Pain Edu? --      Excl. in GC? --    No data found.  Updated Vital Signs BP 133/84 (BP Location: Right Arm)   Pulse 88   Temp 98 F (36.7 C) (Oral)   Resp 20   SpO2 98%   Visual Acuity Right Eye Distance:   Left Eye Distance:   Bilateral Distance:    Right Eye Near:   Left Eye Near:    Bilateral Near:     Physical Exam Vitals and nursing note reviewed.  Constitutional:  Appearance: Normal appearance. He is well-developed.  HENT:     Head: Atraumatic.     Right Ear: External ear normal.     Left Ear: External ear normal.     Nose: Nose normal.     Mouth/Throat:     Mouth: Mucous membranes are moist.     Pharynx: Oropharynx is clear. Posterior oropharyngeal erythema present. No oropharyngeal exudate.  Eyes:     Extraocular Movements: Extraocular movements intact.     Conjunctiva/sclera: Conjunctivae normal.     Pupils: Pupils are equal, round, and reactive to light.  Cardiovascular:     Rate and Rhythm: Normal rate and regular rhythm.  Pulmonary:     Effort: Pulmonary effort is normal. No respiratory distress.     Breath sounds: Normal breath sounds. No wheezing or rales.  Musculoskeletal:        General: Normal range of motion.     Cervical back: Normal range of motion and neck supple.  Lymphadenopathy:     Cervical: No cervical adenopathy.  Skin:    General: Skin is warm  and dry.  Neurological:     General: No focal deficit present.     Mental Status: He is alert and oriented to person, place, and time.  Psychiatric:        Mood and Affect: Mood normal.        Behavior: Behavior normal.        Thought Content: Thought content normal.        Judgment: Judgment normal.      UC Treatments / Results  Labs (all labs ordered are listed, but only abnormal results are displayed) Labs Reviewed - No data to display  EKG   Radiology No results found.  Procedures Procedures (including critical care time)  Medications Ordered in UC Medications - No data to display  Initial Impression / Assessment and Plan / UC Course  I have reviewed the triage vital signs and the nursing notes.  Pertinent labs & imaging results that were available during my care of the patient were reviewed by me and considered in my medical decision making (see chart for details).     Overall exam and vital signs very reassuring today, suspect viral versus allergic bronchitis.  He declines an inhaler today but is agreeable to prednisone, Phenergan DM and discussed supportive over-the-counter medications and home care additionally to help.  Return for worsening symptoms.  Final Clinical Impressions(s) / UC Diagnoses   Final diagnoses:  Acute bronchitis, unspecified organism   Discharge Instructions   None    ED Prescriptions     Medication Sig Dispense Auth. Provider   predniSONE (DELTASONE) 20 MG tablet Take 2 tablets (40 mg total) by mouth daily with breakfast. 10 tablet Particia Nearing, PA-C   promethazine-dextromethorphan (PROMETHAZINE-DM) 6.25-15 MG/5ML syrup Take 5 mLs by mouth 4 (four) times daily as needed. 100 mL Particia Nearing, [place]      PDMP not reviewed this encounter.   Particia Nearing, [place] 04/07/22 1524

## 2022-04-07 NOTE — ED Triage Notes (Signed)
Pt reports a real bad cough and some wheezing that started 2-3 days ago. Pt didn't take any meds for symptoms.
